# Patient Record
Sex: Female | Born: 1965 | Race: White | Hispanic: No | Marital: Married | Smoking: Never smoker
Health system: Southern US, Community
[De-identification: ages and names within clinical notes are randomized; demographics above are authoritative.]

## PROBLEM LIST (undated history)

## (undated) DIAGNOSIS — Z8489 Family history of other specified conditions: Secondary | ICD-10-CM

## (undated) DIAGNOSIS — R519 Headache, unspecified: Secondary | ICD-10-CM

## (undated) DIAGNOSIS — R51 Headache: Secondary | ICD-10-CM

## (undated) DIAGNOSIS — I499 Cardiac arrhythmia, unspecified: Secondary | ICD-10-CM

---

## 2005-12-13 ENCOUNTER — Ambulatory Visit: Payer: Self-pay | Admitting: Unknown Physician Specialty

## 2006-12-30 ENCOUNTER — Other Ambulatory Visit: Payer: Self-pay

## 2006-12-30 ENCOUNTER — Emergency Department: Payer: Self-pay | Admitting: Internal Medicine

## 2007-01-05 ENCOUNTER — Emergency Department: Payer: Self-pay | Admitting: Unknown Physician Specialty

## 2007-01-09 ENCOUNTER — Ambulatory Visit: Payer: Self-pay | Admitting: Family Medicine

## 2008-07-29 IMAGING — CR DG CHEST 1V PORT
1 series · 1 of 1 positions shown · non-contrast
Comparison: none

REASON FOR EXAM: Chest Pain
COMMENTS:

PROCEDURE:     DXR - DXR PORTABLE CHEST SINGLE VIEW  - December 30, 2006  [DATE]
RESULT:     The lungs are adequately inflated and clear. The heart is normal
in size. The pulmonary vascularity is not engorged. There is no pleural
effusion.

[view not recorded]
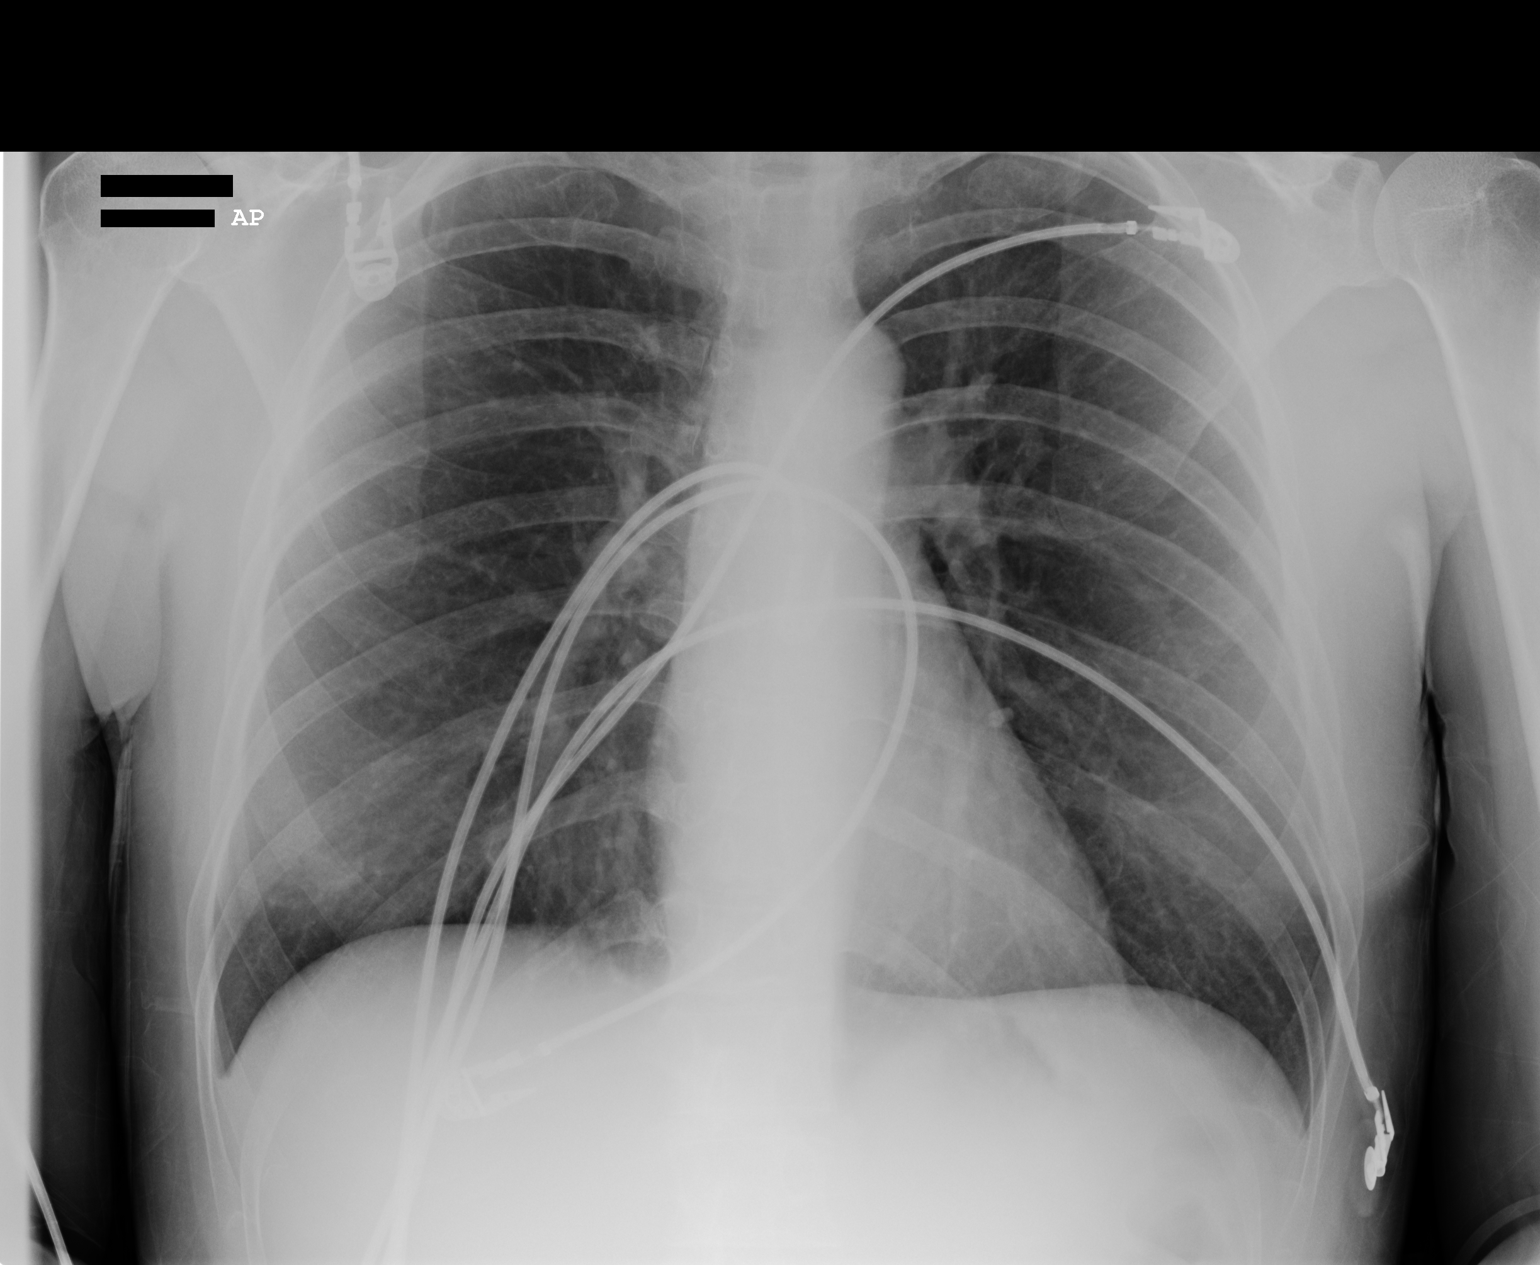

[1 of 1 positions shown; findings below may reference images not displayed]

IMPRESSION: I do not see evidence of CHF or pneumonia. There is mild
hyperinflation which may be voluntary or which may reflect underlying air
trapping. A followup PA and lateral chest x-ray would be of value.

## 2010-04-17 ENCOUNTER — Ambulatory Visit: Payer: Self-pay | Admitting: Unknown Physician Specialty

## 2011-06-05 DIAGNOSIS — F341 Dysthymic disorder: Secondary | ICD-10-CM | POA: Insufficient documentation

## 2012-01-25 ENCOUNTER — Ambulatory Visit: Payer: Self-pay | Admitting: Family Medicine

## 2013-10-21 ENCOUNTER — Ambulatory Visit: Payer: Self-pay | Admitting: Obstetrics and Gynecology

## 2014-09-15 ENCOUNTER — Ambulatory Visit: Payer: Self-pay | Admitting: Family Medicine

## 2014-09-15 LAB — BASIC METABOLIC PANEL
ANION GAP: 10 (ref 7–16)
BUN: 10 mg/dL (ref 7–18)
Calcium, Total: 9.1 mg/dL (ref 8.5–10.1)
Chloride: 104 mmol/L (ref 98–107)
Co2: 29 mmol/L (ref 21–32)
Creatinine: 0.68 mg/dL (ref 0.60–1.30)
EGFR (Non-African Amer.): >60
GLUCOSE: 94 mg/dL (ref 65–99)
Osmolality: 284 (ref 275–301)
Potassium: 4.8 mmol/L (ref 3.5–5.1)
Sodium: 143 mmol/L (ref 136–145)

## 2014-09-15 LAB — CBC WITH DIFFERENTIAL/PLATELET
Basophil #: 0 10*3/uL (ref 0.0–0.1)
Basophil %: 0.8 %
Eosinophil #: 0.1 10*3/uL (ref 0.0–0.7)
Eosinophil %: 1.2 %
HCT: 42.2 % (ref 35.0–47.0)
HGB: 13.8 g/dL (ref 12.0–16.0)
LYMPHS ABS: 0.9 10*3/uL — AB (ref 1.0–3.6)
LYMPHS PCT: 19.6 %
MCH: 30.9 pg (ref 26.0–34.0)
MCHC: 32.7 g/dL (ref 32.0–36.0)
MCV: 94 fL (ref 80–100)
Monocyte #: 0.4 x10 3/mm (ref 0.2–0.9)
Monocyte %: 8.3 %
NEUTROS ABS: 3.3 10*3/uL (ref 1.4–6.5)
Neutrophil %: 70.1 %
Platelet: 275 10*3/uL (ref 150–440)
RBC: 4.48 10*6/uL (ref 3.80–5.20)
RDW: 12.3 % (ref 11.5–14.5)
WBC: 4.7 10*3/uL (ref 3.6–11.0)

## 2014-09-15 LAB — CK-MB: CK-MB: 0.6 ng/mL (ref 0.5–3.6)

## 2014-09-15 LAB — D-DIMER(ARMC): D-Dimer: 294 ng/ml

## 2014-09-15 LAB — CK: CK, Total: 74 U/L (ref 26–192)

## 2014-09-28 DIAGNOSIS — R0789 Other chest pain: Secondary | ICD-10-CM | POA: Insufficient documentation

## 2015-03-03 ENCOUNTER — Encounter: Payer: Self-pay | Admitting: *Deleted

## 2015-03-11 NOTE — Discharge Instructions (Signed)

## 2015-03-14 ENCOUNTER — Ambulatory Visit: Payer: BC Managed Care – PPO | Admitting: Anesthesiology

## 2015-03-14 ENCOUNTER — Ambulatory Visit
Admission: RE | Admit: 2015-03-14 | Discharge: 2015-03-14 | Disposition: A | Discharge status: Home / Self Care | Payer: BC Managed Care – PPO | Source: Ambulatory Visit | Attending: Gastroenterology | Admitting: Gastroenterology

## 2015-03-14 ENCOUNTER — Encounter
Admission: RE | Disposition: A | Discharge status: Home / Self Care | Payer: Self-pay | Source: Ambulatory Visit | Attending: Gastroenterology

## 2015-03-14 ENCOUNTER — Other Ambulatory Visit: Payer: Self-pay | Admitting: Gastroenterology

## 2015-03-14 DIAGNOSIS — Z1211 Encounter for screening for malignant neoplasm of colon: Secondary | ICD-10-CM | POA: Insufficient documentation

## 2015-03-14 DIAGNOSIS — R002 Palpitations: Secondary | ICD-10-CM | POA: Insufficient documentation

## 2015-03-14 DIAGNOSIS — D122 Benign neoplasm of ascending colon: Secondary | ICD-10-CM | POA: Diagnosis not present

## 2015-03-14 DIAGNOSIS — K5730 Diverticulosis of large intestine without perforation or abscess without bleeding: Secondary | ICD-10-CM | POA: Diagnosis not present

## 2015-03-14 DIAGNOSIS — K640 First degree hemorrhoids: Secondary | ICD-10-CM | POA: Diagnosis not present

## 2015-03-14 HISTORY — PX: COLONOSCOPY: SHX5424

## 2015-03-14 HISTORY — DX: Headache: R51

## 2015-03-14 HISTORY — DX: Cardiac arrhythmia, unspecified: I49.9

## 2015-03-14 HISTORY — DX: Family history of other specified conditions: Z84.89

## 2015-03-14 HISTORY — DX: Headache, unspecified: R51.9

## 2015-03-14 SURGERY — COLONOSCOPY
Anesthesia: Monitor Anesthesia Care | Wound class: Contaminated

## 2015-03-14 MED ORDER — PROPOFOL 10 MG/ML IV BOLUS
INTRAVENOUS | Status: DC | PRN
  Administered 2015-03-14: 70 mg via INTRAVENOUS
  Administered 2015-03-14: 10 mg via INTRAVENOUS
  Administered 2015-03-14: 40 mg via INTRAVENOUS
  Administered 2015-03-14: 70 mg via INTRAVENOUS
  Administered 2015-03-14 (×2): 60 mg via INTRAVENOUS

## 2015-03-14 MED ORDER — STERILE WATER FOR IRRIGATION IR SOLN
Status: DC | PRN
  Administered 2015-03-14: 11:00:00

## 2015-03-14 MED ORDER — LACTATED RINGERS IV SOLN
INTRAVENOUS | Status: DC
  Administered 2015-03-14 (×2): via INTRAVENOUS

## 2015-03-14 MED ORDER — LIDOCAINE HCL (CARDIAC) 20 MG/ML IV SOLN
INTRAVENOUS | Status: DC | PRN
  Administered 2015-03-14: 30 mg via INTRAVENOUS

## 2015-03-14 SURGICAL SUPPLY — 28 items
CANISTER SUCT 1200ML W/VALVE (MISCELLANEOUS) ×3 IMPLANT
FCP ESCP3.2XJMB 240X2.8X (MISCELLANEOUS) ×1
FORCEPS BIOP RAD 4 LRG CAP 4 (CUTTING FORCEPS) IMPLANT
FORCEPS BIOP RJ4 240 W/NDL (MISCELLANEOUS) ×2
FORCEPS ESCP3.2XJMB 240X2.8X (MISCELLANEOUS) ×1 IMPLANT
GOWN CVR UNV OPN BCK APRN NK (MISCELLANEOUS) ×2 IMPLANT
GOWN ISOL THUMB LOOP REG UNIV (MISCELLANEOUS) ×4
HEMOCLIP INSTINCT (CLIP) IMPLANT
INJECTOR VARIJECT VIN23 (MISCELLANEOUS) IMPLANT
KIT CO2 TUBING (TUBING) IMPLANT
KIT DEFENDO VALVE AND CONN (KITS) IMPLANT
KIT ENDO PROCEDURE OLY (KITS) ×3 IMPLANT
LIGATOR MULTIBAND 6SHOOTER MBL (MISCELLANEOUS) IMPLANT
MARKER SPOT ENDO TATTOO 5ML (MISCELLANEOUS) IMPLANT
PAD GROUND ADULT SPLIT (MISCELLANEOUS) IMPLANT
SNARE SHORT THROW 13M SML OVAL (MISCELLANEOUS) IMPLANT
SNARE SHORT THROW 30M LRG OVAL (MISCELLANEOUS) IMPLANT
SPOT EX ENDOSCOPIC TATTOO (MISCELLANEOUS)
SUCTION POLY TRAP 4CHAMBER (MISCELLANEOUS) IMPLANT
TRAP SUCTION POLY (MISCELLANEOUS) IMPLANT
TUBING CONN 6MMX3.1M (TUBING)
TUBING SUCTION CONN 0.25 STRL (TUBING) IMPLANT
UNDERPAD 30X60 958B10 (PK) (MISCELLANEOUS) IMPLANT
VALVE BIOPSY ENDO (VALVE) IMPLANT
VARIJECT INJECTOR VIN23 (MISCELLANEOUS)
WATER AUXILLARY (MISCELLANEOUS) IMPLANT
WATER STERILE IRR 250ML POUR (IV SOLUTION) ×3 IMPLANT
WATER STERILE IRR 500ML POUR (IV SOLUTION) IMPLANT

## 2015-03-14 NOTE — Anesthesia Postprocedure Evaluation (Signed)
  Anesthesia Post-op Note  Patient: Savannah Ross  Procedure(s) Performed: Procedure(s): COLONOSCOPY (N/A)  Anesthesia type:MAC  Patient location: PACU  Post pain: Pain level controlled  Post assessment: Post-op Vital signs reviewed, Patient's Cardiovascular Status Stable, Respiratory Function Stable, Patent Airway and No signs of Nausea or vomiting  Post vital signs: Reviewed and stable  Last Vitals:  Filed Vitals:   03/14/15 1052  BP:   Pulse:   Temp: 36.4 C  Resp: 14    Level of consciousness: awake, alert  and patient cooperative  Complications: No apparent anesthesia complications

## 2015-03-14 NOTE — Transfer of Care (Signed)
Immediate Anesthesia Transfer of Care Note  Patient: Savannah Ross  Procedure(s) Performed: Procedure(s): COLONOSCOPY (N/A)  Patient Location: PACU  Anesthesia Type: MAC  Level of Consciousness: awake, alert  and patient cooperative  Airway and Oxygen Therapy: Patient Spontanous Breathing and Patient connected to supplemental oxygen  Post-op Assessment: Post-op Vital signs reviewed, Patient's Cardiovascular Status Stable, Respiratory Function Stable, Patent Airway and No signs of Nausea or vomiting  Post-op Vital Signs: Reviewed and stable  Complications: No apparent anesthesia complications

## 2015-03-14 NOTE — H&P (Signed)
  [facility]  [address] [place], [place] [address] Phone: [phone] Fax : [phone]  Primary Care Physician:  No primary care provider on file. Primary Gastroenterologist:  Dr. Allen Norris  Pre-Procedure History & Physical: HPI:  Savannah Ross is a 49 y.o. female is here for a screening colonoscopy.   Past Medical History  Diagnosis Date  . Family history of adverse reaction to anesthesia     mother and sister - PONV  . Dysrhythmia     palpatations - stress/echo - see note Carre Everywhere - Dr. Ubaldo Glassing 10/19/14  . Headache     migraines - rare    History reviewed. No pertinent past surgical history.  Prior to Admission medications   Not on File    Allergies as of 03/03/2015  . (No Known Allergies)    History reviewed. No pertinent family history.  History   Social History  . Marital Status: Married    Spouse Name: N/A  . Number of Children: N/A  . Years of Education: N/A   Occupational History  . Not on file.   Social History Main Topics  . Smoking status: Never Smoker   . Smokeless tobacco: Not on file  . Alcohol Use: No  . Drug Use: Not on file  . Sexual Activity: Not on file   Other Topics Concern  . Not on file   Social History Narrative  . No narrative on file    Review of Systems: See HPI, otherwise negative ROS  Physical Exam: BP 132/98 mmHg  Pulse 64  Temp(Src) 97.9 F (36.6 C) (Temporal)  Resp 16  Ht 5\' 10"  (1.778 m)  Wt 179 lb (81.194 kg)  BMI 25.68 kg/m2  SpO2 99%  LMP  General:   Alert,  pleasant and cooperative in NAD Head:  Normocephalic and atraumatic. Neck:  Supple; no masses or thyromegaly. Lungs:  Clear throughout to auscultation.    Heart:  Regular rate and rhythm. Abdomen:  Soft, nontender and nondistended. Normal bowel sounds, without guarding, and without rebound.   Neurologic:  Alert and  oriented x4;  grossly normal neurologically.  Impression/Plan: Savannah Ross is now here to  undergo a screening colonoscopy.  Risks, benefits, and alternatives regarding colonoscopy have been reviewed with the patient.  Questions have been answered.  All parties agreeable.

## 2015-03-14 NOTE — Op Note (Signed)
[facility] Patient Name: Savannah Ross Procedure Date: 03/14/2015 10:28 AM MRN: [id number] Account #: [id number] Date of Birth: 03/09/1966 Admit Type: Outpatient Age: 49 Room: [facility] OR ROOM 01 Gender: Female Note Status: Finalized Procedure:         Colonoscopy Indications:       Screening for colorectal malignant neoplasm Providers:         Lucilla Lame, MD Referring MD:      Dani Gobble. Huprich (Referring MD) Medicines:         Propofol per Anesthesia Complications:     No immediate complications. Procedure:         Pre-Anesthesia Assessment:                    - Prior to the procedure, a History and Physical was                     performed, and patient medications and allergies were                     reviewed. The patient's tolerance of previous anesthesia                     was also reviewed. The risks and benefits of the procedure                     and the sedation options and risks were discussed with the                     patient. All questions were answered, and informed consent                     was obtained. Prior Anticoagulants: The patient has taken                     no previous anticoagulant or antiplatelet agents. ASA                     Grade Assessment: II - A patient with mild systemic                     disease. After reviewing the risks and benefits, the                     patient was deemed in satisfactory condition to undergo                     the procedure.                    After obtaining informed consent, the colonoscope was                     passed under direct vision. Throughout the procedure, the                     patient's blood pressure, pulse, and oxygen saturations                     were monitored continuously. The Colonoscope was                     introduced through the anus and advanced to the the cecum,  identified by appendiceal orifice and ileocecal valve. The               colonoscopy was performed without difficulty. The patient                     tolerated the procedure well. The quality of the bowel                     preparation was excellent. Findings:      The perianal and digital rectal examinations were normal.      A 4 mm polyp was found in the ascending colon. The polyp was sessile.       The polyp was removed with a cold biopsy forceps. Resection and       retrieval were complete.      Multiple small-mouthed diverticula were found in the sigmoid colon.      Non-bleeding internal hemorrhoids were found during retroflexion. The       hemorrhoids were Grade I (internal hemorrhoids that do not prolapse). Impression:        - One 4 mm polyp in the ascending colon. Resected and                     retrieved.                    - Diverticulosis in the sigmoid colon.                    - Non-bleeding internal hemorrhoids. Recommendation:    - Await pathology results.                    - Repeat colonoscopy in 5 years if polyp adenoma and 10                     years if hyperplastic Procedure Code(s): --- Professional ---                    [phone], Colonoscopy, flexible; with biopsy, single or                     multiple Diagnosis Code(s): --- Professional ---                    Z12.11, Encounter for screening for malignant neoplasm of                     colon                    D12.2, Benign neoplasm of ascending colon CPT copyright 2014 American Medical Association. All rights reserved. The codes documented in this report are preliminary and upon coder review may  be revised to meet current compliance requirements. Lucilla Lame, MD 03/14/2015 10:50:15 AM This report has been signed electronically. Number of Addenda: 0 Note Initiated On: 03/14/2015 10:28 AM Scope Withdrawal Time: 0 hours 6 minutes 52 seconds  Total Procedure Duration: 0 hours 12 minutes 26 seconds       [facility]

## 2015-03-14 NOTE — Anesthesia Procedure Notes (Signed)
Procedure Name: MAC Performed by: Amelda Hapke Pre-anesthesia Checklist: Patient identified, Emergency Drugs available, Suction available, Patient being monitored and Timeout performed Patient Re-evaluated:Patient Re-evaluated prior to inductionOxygen Delivery Method: Nasal cannula       

## 2015-03-14 NOTE — Anesthesia Preprocedure Evaluation (Signed)
Anesthesia Evaluation  Patient identified by MRN, date of birth, ID band Patient awake    Airway Mallampati: II  TM Distance: >3 FB Neck ROM: Full    Dental   Pulmonary          Cardiovascular     Neuro/Psych    GI/Hepatic   Endo/Other    Renal/GU      Musculoskeletal   Abdominal   Peds  Hematology   Anesthesia Other Findings   Reproductive/Obstetrics                             Anesthesia Physical Anesthesia Plan  ASA: I  Anesthesia Plan: MAC   Post-op Pain Management:    Induction:   Airway Management Planned:   Additional Equipment:   Intra-op Plan:   Post-operative Plan:   Informed Consent: I have reviewed the patients History and Physical, chart, labs and discussed the procedure including the risks, benefits and alternatives for the proposed anesthesia with the patient or authorized representative who has indicated his/her understanding and acceptance.     Plan Discussed with: CRNA  Anesthesia Plan Comments:         Anesthesia Quick Evaluation

## 2015-03-15 ENCOUNTER — Encounter: Payer: Self-pay | Admitting: Gastroenterology

## 2015-05-09 ENCOUNTER — Encounter: Payer: Self-pay | Admitting: Gastroenterology

## 2015-09-17 ENCOUNTER — Ambulatory Visit
Admission: EM | Admit: 2015-09-17 | Discharge: 2015-09-17 | Disposition: A | Discharge status: Home / Self Care | Payer: BC Managed Care – PPO | Attending: Internal Medicine | Admitting: Internal Medicine

## 2015-09-17 DIAGNOSIS — J069 Acute upper respiratory infection, unspecified: Secondary | ICD-10-CM

## 2015-09-17 DIAGNOSIS — H6982 Other specified disorders of Eustachian tube, left ear: Secondary | ICD-10-CM

## 2015-09-17 MED ORDER — AZITHROMYCIN 250 MG PO TABS: 250.0000 mg | ORAL_TABLET | Freq: Every day | ORAL | Status: DC

## 2015-09-17 MED ORDER — FLUTICASONE PROPIONATE 50 MCG/ACT NA SUSP: 2.0000 | Freq: Every day | NASAL | Status: DC

## 2015-09-17 NOTE — ED Provider Notes (Signed)
CSN: [id number]     Arrival date & time 09/17/15  1157 History   First MD Initiated Contact with Patient 09/17/15 1342     Chief Complaint  Patient presents with  . Sore Throat    Pt with left sided sore throat and coughing up yellow phlegm. No fever. Pain 6/10. Has been to PCP twice for same and no ABX given, only Tussionex and nasal spray.   (Consider location/radiation/quality/duration/timing/severity/associated sxs/prior Treatment) HPI  49 yo F reports 2 week hx of sore throat and cough. Seen twice by PCP with Strep reported negative. Now having left facial pressure discomfort,left eye drainage without conjunctivitis, left ant chain tenderness  that decreases with direct pressure,Cough with phlegm . Sleeps on left side.  Has Tussionex from PCP. They did not do 3 day report Denies hx BP elevation- has been taking cough/cold medications Past Medical History  Diagnosis Date  . Family history of adverse reaction to anesthesia     mother and sister - PONV  . Dysrhythmia     palpatations - stress/echo - see note Carre Everywhere - Dr. Ubaldo Glassing 10/19/14  . Headache     migraines - rare   Past Surgical History  Procedure Laterality Date  . Colonoscopy N/A 03/14/2015    Procedure: COLONOSCOPY;  Surgeon: Lucilla Lame, MD;  Location: [place];  Service: Gastroenterology;  Laterality: N/A;   History reviewed. No pertinent family history. Social History  Substance Use Topics  . Smoking status: Never Smoker   . Smokeless tobacco: None  . Alcohol Use: No   OB History    No data available     Review of Systems Constitutional: No fever.  Eyes: No visual changes. Drainage today left [id number] sore throat, left side.; left ear pops and squeaks, pressure Cardiovascular:Negative for chest pain/palpitations Respiratory: Negative for shortness of breath Gastrointestinal: No abdominal pain. No nausea,vomiting, diarrhea Genitourinary: Negative for dysuria. Normal  urination. Musculoskeletal: Negative for back pain. FROM extremities without pain Skin: Negative for rash Neurological: Negative for headache, focal weakness or numbness  Allergies  Review of patient's allergies indicates no known allergies.  Home Medications   Prior to Admission medications   Medication Sig Start Date End Date Taking? Authorizing Provider  chlorpheniramine-HYDROcodone (TUSSIONEX) 10-8 MG/5ML SUER Take 5 mLs by mouth.   Yes Historical Provider, MD  azithromycin (ZITHROMAX) 250 MG tablet Take 1 tablet (250 mg total) by mouth daily. Take first 2 tablets together, then 1 every day until finished. 09/17/15   Jan Fireman, PA-C  fluticasone [facility]) 50 MCG/ACT nasal spray Place 2 sprays into both nostrils daily. 09/17/15   Jan Fireman, PA-C   Meds Ordered and Administered this Visit  Medications - No data to display  BP 155/90 mmHg  Pulse 90  Temp(Src) 97.9 F (36.6 C) (Oral)  Resp 18  Ht 5\' 10"  (1.778 m)  Wt 170 lb (77.111 kg)  BMI 24.39 kg/m2  SpO2 100% No data found.   Physical Exam  General: NAD, does not appear toxic HEENT:normocephalic,atraumatic, mucous membranes moist,no increased erythema.  no visual implication of peritonsillar abscess, may have supra-epiglottitis; Ears: grossly normal hearing, left TM retracted,  Eyes: EOMI, conjunctiva clear, conjugate gaze Neck: supple,no lymphadenopathy Resp : CT A, bilat; normal respiratory effort Card : RRR Abd:  Not distended Skin: no rash, skin intact MSK: no deformities, ambulatory without assistance Neuro : good attention,recall-good memory, no focal neuro deficits Psych: speech and behavior appropriate   ED Course  Procedures (including critical care  time)  Labs Review Labs Reviewed - No data to display  Imaging Review No results found.       MDM   1. Eustachian tube dysfunction, left   2. URI (upper respiratory infection)     Plan: Test/x-ray results and diagnosis reviewed with  patient Rx as per orders;  benefits, risks, potential side effects reviewed   Recommend supportive treatment with cyclic tylenol and ibuprofen Add Zyrtec Add Fluticasone Use Tussionex from PCP bedtime Add Bezonatate Encourage increased hydration, steamy showers, Seek additional medical care if symptoms worsen or are not improvin  Discharge Medication List as of 09/17/2015  2:18 PM    START taking these medications   Details  azithromycin (ZITHROMAX) 250 MG tablet Take 1 tablet (250 mg total) by mouth daily. Take first 2 tablets together, then 1 every day until finished., Starting 09/17/2015, Until Discontinued, Print        Jan Fireman, PA-C 09/20/15 1000

## 2015-09-17 NOTE — Discharge Instructions (Signed)
Add back Zyrtec 10mg  once daily   Add Fluticasone nasal spray as directed  Add Zpack  2 tablets today then one tablet daily for 4 days  Increase fluids by mouth, steam, vaporizer  Ibuprofen 600mg  ( 3 tabs) three tablets with meals for 3-5 days    Barotitis Media Barotitis media is inflammation of your middle ear. This occurs when the auditory tube (eustachian tube) leading from the back of your nose (nasopharynx) to your eardrum is blocked. This blockage may result from a cold, environmental allergies, or an upper respiratory infection. Unresolved barotitis media may lead to damage or hearing loss (barotrauma), which may become permanent. HOME CARE INSTRUCTIONS   Use medicines as recommended by your health care provider. Over-the-counter medicines will help unblock the canal and can help during times of air travel.  Do not put anything into your ears to clean or unplug them. Eardrops will not be helpful.  Do not swim, dive, or fly until your health care provider says it is all right to do so. If these activities are necessary, chewing gum with frequent, forceful swallowing may help. It is also helpful to hold your nose and gently blow to pop your ears for equalizing pressure changes. This forces air into the eustachian tube.  Only take over-the-counter or prescription medicines for pain, discomfort, or fever as directed by your health care provider.  A decongestant may be helpful in decongesting the middle ear and make pressure equalization easier. SEEK MEDICAL CARE IF:  You experience a serious form of dizziness in which you feel as if the room is spinning and you feel nauseated (vertigo).  Your symptoms only involve one ear. SEEK IMMEDIATE MEDICAL CARE IF:   You develop a severe headache, dizziness, or severe ear pain.  You have bloody or pus-like drainage from your ears.  You develop a fever.  Your problems do not improve or become worse. MAKE SURE YOU:   Understand these  instructions.  Will watch your condition.  Will get help right away if you are not doing well or get worse.   This information is not intended to replace advice given to you by your health care provider. Make sure you discuss any questions you have with your health care provider.   Document Released: 09/14/2000 Document Revised: 07/08/2013 Document Reviewed: 04/14/2013 Elsevier Interactive Patient Education [employer].

## 2018-07-01 ENCOUNTER — Ambulatory Visit
Admission: RE | Admit: 2018-07-01 | Discharge: 2018-07-01 | Disposition: A | Discharge status: Home / Self Care | Payer: BC Managed Care – PPO | Source: Ambulatory Visit | Attending: Family Medicine | Admitting: Family Medicine

## 2018-07-01 ENCOUNTER — Other Ambulatory Visit: Payer: Self-pay | Admitting: Family Medicine

## 2018-07-01 DIAGNOSIS — S92512A Displaced fracture of proximal phalanx of left lesser toe(s), initial encounter for closed fracture: Secondary | ICD-10-CM | POA: Insufficient documentation

## 2018-07-01 DIAGNOSIS — S99922A Unspecified injury of left foot, initial encounter: Secondary | ICD-10-CM

## 2018-07-01 DIAGNOSIS — M79675 Pain in left toe(s): Secondary | ICD-10-CM | POA: Diagnosis not present

## 2018-07-01 DIAGNOSIS — R600 Localized edema: Secondary | ICD-10-CM | POA: Insufficient documentation

## 2018-12-01 DIAGNOSIS — J301 Allergic rhinitis due to pollen: Secondary | ICD-10-CM | POA: Insufficient documentation

## 2019-06-11 ENCOUNTER — Other Ambulatory Visit (HOSPITAL_COMMUNITY)
Admission: RE | Admit: 2019-06-11 | Discharge: 2019-06-11 | Disposition: A | Discharge status: Home / Self Care | Payer: BC Managed Care – PPO | Source: Ambulatory Visit | Attending: Obstetrics and Gynecology | Admitting: Obstetrics and Gynecology

## 2019-06-11 ENCOUNTER — Encounter: Payer: Self-pay | Admitting: Obstetrics and Gynecology

## 2019-06-11 ENCOUNTER — Ambulatory Visit (INDEPENDENT_AMBULATORY_CARE_PROVIDER_SITE_OTHER): Payer: BC Managed Care – PPO | Admitting: Obstetrics and Gynecology

## 2019-06-11 ENCOUNTER — Other Ambulatory Visit: Payer: Self-pay

## 2019-06-11 VITALS — BP 133/87 | HR 80 | Ht 70.0 in | Wt 189.0 lb

## 2019-06-11 DIAGNOSIS — M25569 Pain in unspecified knee: Secondary | ICD-10-CM

## 2019-06-11 DIAGNOSIS — Z124 Encounter for screening for malignant neoplasm of cervix: Secondary | ICD-10-CM

## 2019-06-11 DIAGNOSIS — E663 Overweight: Secondary | ICD-10-CM

## 2019-06-11 DIAGNOSIS — Z6827 Body mass index (BMI) 27.0-27.9, adult: Secondary | ICD-10-CM | POA: Diagnosis not present

## 2019-06-11 DIAGNOSIS — G4700 Insomnia, unspecified: Secondary | ICD-10-CM

## 2019-06-11 DIAGNOSIS — Z1239 Encounter for other screening for malignant neoplasm of breast: Secondary | ICD-10-CM

## 2019-06-11 DIAGNOSIS — Z01419 Encounter for gynecological examination (general) (routine) without abnormal findings: Secondary | ICD-10-CM

## 2019-06-11 MED ORDER — PHENTERMINE HCL 37.5 MG PO TABS: 37.5000 mg | ORAL_TABLET | Freq: Every day | ORAL | 0 refills | Status: DC

## 2019-06-11 MED ORDER — TRAZODONE HCL 50 MG PO TABS: 50.0000 mg | ORAL_TABLET | Freq: Every evening | ORAL | 3 refills | Status: DC | PRN

## 2019-06-11 NOTE — Progress Notes (Signed)
Gynecology Annual Exam  PCP: Gayland Curry, MD  Chief Complaint:  Chief Complaint  Patient presents with  . Gynecologic Exam    History of Present Illness:Patient is a 53 y.o. No obstetric history on file. presents for annual exam. The patient has no complaints today.   LMP: No LMP recorded. Patient has had an ablation.  The patient is sexually active. She denies dyspareunia.   There is no notable family history of breast or ovarian cancer in her family.  The patient wears seatbelts: yes.     The patient denies current symptoms of depression.      Patientis a 53 y.o. G2P2 female, who presents for the evaluation of weight gain. She has gained 10 pounds primarily over past year. The patient states the following issues have contributed to her weight problem: menopause, decreased time for physical activity although her diet has remained the same.  [place] also [place]  The patient has  additional symptoms, joint pain . The patient specifically denies memory loss, muscle weakness, excessive thirst, and polyuria.  The patient's past medical history is non-contributory. She has not tried interventions in the past.  Review of Systems: Review of Systems  Constitutional: Negative for chills and fever.  HENT: Negative for congestion.   Respiratory: Negative for cough and shortness of breath.   Cardiovascular: Negative for chest pain and palpitations.  Gastrointestinal: Negative for abdominal pain, constipation, diarrhea, heartburn, nausea and vomiting.  Genitourinary: Negative for dysuria, frequency and urgency.  Skin: Negative for itching and rash.  Neurological: Negative for dizziness and headaches.  Endo/Heme/Allergies: Negative for polydipsia.  Psychiatric/Behavioral: Negative for depression.    Past Medical History:  Past Medical History:  Diagnosis Date  . Dysrhythmia    palpatations - stress/echo - see note Carre Everywhere - Dr. Ubaldo Glassing 10/19/14   . Family history of adverse reaction to anesthesia    mother and sister - PONV  . Headache    migraines - rare    Past Surgical History:  Past Surgical History:  Procedure Laterality Date  . COLONOSCOPY N/A 03/14/2015   Procedure: COLONOSCOPY;  Surgeon: Lucilla Lame, MD;  Location: [facility];  Service: Gastroenterology;  Laterality: N/A;    Gynecologic History:  No LMP recorded. Patient has had an ablation.  Obstetric History: No obstetric history on file.  Family History:  History reviewed. No pertinent family history.  Social History:  Social History   Socioeconomic History  . Marital status: Married    Spouse name: Not on file  . Number of children: Not on file  . Years of education: Not on file  . Highest education level: Not on file  Occupational History  . Not on file  Social Needs  . Financial resource strain: Not on file  . Food insecurity    Worry: Not on file    Inability: Not on file  . Transportation needs    Medical: Not on file    Non-medical: Not on file  Tobacco Use  . Smoking status: Never Smoker  . Smokeless tobacco: Never Used  Substance and Sexual Activity  . Alcohol use: No  . Drug use: Never  . Sexual activity: Yes    Birth control/protection: None  Lifestyle  . Physical activity    Days per week: Not on file    Minutes per session: Not on file  . Stress: Not on file  Relationships  . Social Herbalist on  phone: Not on file    Gets together: Not on file    Attends religious service: Not on file    Active member of club or organization: Not on file    Attends meetings of clubs or organizations: Not on file    Relationship status: Not on file  . Intimate partner violence    Fear of current or ex partner: Not on file    Emotionally abused: Not on file    Physically abused: Not on file    Forced sexual activity: Not on file  Other Topics Concern  . Not on file  Social History Narrative  . Not on file     Allergies:  No Known Allergies  Medications: Prior to Admission medications   Medication Sig Start Date End Date Taking? Authorizing Provider  azithromycin (ZITHROMAX) 250 MG tablet Take 1 tablet (250 mg total) by mouth daily. Take first 2 tablets together, then 1 every day until finished. 09/17/15   Jan Fireman, PA-C  chlorpheniramine-HYDROcodone (TUSSIONEX) 10-8 MG/5ML SUER Take 5 mLs by mouth.    [provider]  fluticasone (FLONASE) 50 MCG/ACT nasal spray Place 2 sprays into both nostrils daily. 09/17/15   Jan Fireman, PA-C    Physical Exam Vitals: Blood pressure 133/87, pulse 80, height 5\' 10"  (1.778 m), weight 189 lb (85.7 kg).   General: NAD HEENT: normocephalic, anicteric Thyroid: no enlargement, no palpable nodules Pulmonary: No increased work of breathing, CTAB Cardiovascular: RRR, distal pulses 2+ Breast: Breast symmetrical, no tenderness, no palpable nodules or masses, no skin or nipple retraction present, no nipple discharge.  No axillary or supraclavicular lymphadenopathy. Abdomen: NABS, soft, non-tender, non-distended.  Umbilicus without lesions.  No hepatomegaly, splenomegaly or masses palpable. No evidence of hernia  Genitourinary:  External: Normal external female genitalia.  Normal urethral meatus, normal Bartholin's and Skene's glands.    Vagina: Normal vaginal mucosa, no evidence of prolapse.    Cervix: Grossly normal in appearance, no bleeding  Uterus: Non-enlarged, mobile, normal contour.  No CMT  Adnexa: ovaries non-enlarged, no adnexal masses  Rectal: deferred  Lymphatic: no evidence of inguinal lymphadenopathy Extremities: no edema, erythema, or tenderness Neurologic: Grossly intact Psychiatric: mood appropriate, affect full  Female chaperone present for pelvic and breast  portions of the physical exam     Assessment: 52 y.o. No obstetric history on file. routine annual exam  Plan: Problem List Items Addressed This Visit    None     Visit Diagnoses    Encounter for gynecological examination without abnormal finding    -  Primary   Screening for malignant neoplasm of cervix       Relevant Orders   Cytology - PAP   Breast screening       Relevant Orders   MM 3D SCREEN BREAST BILATERAL   Insomnia, unspecified type       Arthralgia of knee, unspecified laterality       Overweight (BMI 25.0-29.9)       BMI 27.0-27.9,adult         Annual  1) Mammogram - recommend yearly screening mammogram.  Mammogram Was ordered today  2) STI screening  was notoffered and therefore not obtained  3) ASCCP guidelines and rational discussed.  Patient opts for every 3 years screening interval  4) Osteoporosis  - per USPTF routine screening DEXA at age 23  Consider FDA-approved medical therapies in postmenopausal women and men aged 69 years and older, based on the following: a) A hip or  vertebral (clinical or morphometric) fracture b) T-score ? -2.5 at the femoral neck or spine after appropriate evaluation to exclude secondary causes C) Low bone mass (T-score between -1.0 and -2.5 at the femoral neck or spine) and a 10-year probability of a hip fracture ? 3% or a 10-year probability of a major osteoporosis-related fracture ? 20% based on the [place]-adapted WHO algorithm   5) Routine healthcare maintenance including cholesterol, diabetes screening discussed managed by PCP  6) Colonoscopy  - 03/14/2015 [place], 1 polyp tubular adenoma negative for high grade dysplasia  - repeat 5 years 2021  7) Insomnia - trial of trazodone  8) Return in about 4 weeks (around 07/09/2019) for telephone visit medication follow up.   Medical Weight Loss  1) 1500 Calorie ADA Diet  2) Patient education given regarding appropriate lifestyle changes for weight loss including: regular physical activity, healthy coping strategies, caloric restriction and healthy eating patterns.  3) Patient will be started on weight loss medication. The risks and benefits and  side effects of medication, such as Adipex (Phenteramine) ,  Tenuate (Diethylproprion), Belviq (lorcarsin), Contrave (buproprion/naltrexone), Qsymia (phentermine/topiramate), and Saxenda (liraglutide) is discussed. The pros and cons of suppressing appetite and boosting metabolism is discussed. Risks of tolerence and addiction is discussed for selected agents discussed. Use of medicine will ne short term, such as 3-4 months at a time followed by a period of time off of the medicine to avoid these risks and side effects for Adipex, Qsymia, and Tenuate discussed. Pt to call with any negative side effects and agrees to keep follow up appts.  4) Comorbidity Screening - hypothyroidism screening, diabetes, and hyperlipidemia screening offered  5) Encouraged weekly weight monitorig to track progress and sample 1 week food diary  6) Contraception - N/A  7) 15 minutes face-to-face; counseling/coordination of care > 50 percent of visit  8) Follow up in 4 weeks to assess response       Malachy Mood, MD [place], [place] 06/11/2019, 3:31 PM

## 2019-06-11 NOTE — Patient Instructions (Signed)
[facility] [address] [place] [address]  [facility]  [address] [place] [address]  Phone: [phone]  

## 2019-06-15 LAB — CYTOLOGY - PAP
Diagnosis: NEGATIVE
HPV: NOT DETECTED

## 2019-07-09 ENCOUNTER — Other Ambulatory Visit: Payer: Self-pay

## 2019-07-09 ENCOUNTER — Encounter: Payer: Self-pay | Admitting: Obstetrics and Gynecology

## 2019-07-09 ENCOUNTER — Ambulatory Visit (INDEPENDENT_AMBULATORY_CARE_PROVIDER_SITE_OTHER): Payer: BC Managed Care – PPO | Admitting: Obstetrics and Gynecology

## 2019-07-09 VITALS — Ht 70.5 in | Wt 183.0 lb

## 2019-07-09 DIAGNOSIS — Z6825 Body mass index (BMI) 25.0-25.9, adult: Secondary | ICD-10-CM | POA: Diagnosis not present

## 2019-07-09 DIAGNOSIS — E663 Overweight: Secondary | ICD-10-CM

## 2019-07-09 MED ORDER — PHENTERMINE HCL 37.5 MG PO TABS: 37.5000 mg | ORAL_TABLET | Freq: Every day | ORAL | 0 refills | Status: DC

## 2019-07-09 NOTE — Progress Notes (Signed)
I connected with Savannah Ross on 07/13/19 at  4:10 PM EDT by telephone and verified that I am speaking with the correct person using two identifiers.   I discussed the limitations, risks, security and privacy concerns of performing an evaluation and management service by telephone and the availability of in person appointments. I also discussed with the patient that there may be a patient responsible charge related to this service. The patient expressed understanding and agreed to proceed.  The patient was at home I spoke with the patient from my workstation phone The names of people involved in this encounter were: Savannah Ross , and Malachy Mood   Gynecology Office Visit  Chief Complaint:  Chief Complaint  Patient presents with  . Weight Check    History of Present Illness: Patientis a 53 y.o. G2P2 female, who presents for the evaluation of the desire to lose weight. She has lost 5 pounds 1 months. The patient states the following symptoms since starting her weight loss therapy: appetite suppression, energy, and weight loss.  The patient also reports no other ill effects. The patient specifically denies heart palpitations, anxiety, and insomnia.    Review of Systems: 10 point review of systems negative unless otherwise noted in HPI  Past Medical History:  Past Medical History:  Diagnosis Date  . Dysrhythmia    palpatations - stress/echo - see note Carre Everywhere - Dr. Ubaldo Glassing 10/19/14  . Family history of adverse reaction to anesthesia    mother and sister - PONV  . Headache    migraines - rare    Past Surgical History:  Past Surgical History:  Procedure Laterality Date  . COLONOSCOPY N/A 03/14/2015   Procedure: COLONOSCOPY;  Surgeon: Lucilla Lame, MD;  Location: [place];  Service: Gastroenterology;  Laterality: N/A;    Gynecologic History: No LMP recorded. Patient has had an ablation.  Obstetric History: G2P2  Family History:  No family  history on file.  Social History:  Social History   Socioeconomic History  . Marital status: Married    Spouse name: Not on file  . Number of children: Not on file  . Years of education: Not on file  . Highest education level: Not on file  Occupational History  . Not on file  Social Needs  . Financial resource strain: Not on file  . Food insecurity    Worry: Not on file    Inability: Not on file  . Transportation needs    Medical: Not on file    Non-medical: Not on file  Tobacco Use  . Smoking status: Never Smoker  . Smokeless tobacco: Never Used  Substance and Sexual Activity  . Alcohol use: No  . Drug use: Never  . Sexual activity: Yes    Birth control/protection: None  Lifestyle  . Physical activity    Days per week: Not on file    Minutes per session: Not on file  . Stress: Not on file  Relationships  . Social Herbalist on phone: Not on file    Gets together: Not on file    Attends religious service: Not on file    Active member of club or organization: Not on file    Attends meetings of clubs or organizations: Not on file    Relationship status: Not on file  . Intimate partner violence    Fear of current or ex partner: Not on file    Emotionally abused: Not on file  Physically abused: Not on file    Forced sexual activity: Not on file  Other Topics Concern  . Not on file  Social History Narrative  . Not on file    Allergies:  No Known Allergies  Medications: Prior to Admission medications   Medication Sig Start Date End Date Taking? Authorizing Provider  phentermine (ADIPEX-P) 37.5 MG tablet Take 1 tablet (37.5 mg total) by mouth daily before breakfast. 06/11/19  Yes Malachy Mood, MD  traZODone (DESYREL) 50 MG tablet Take 1 tablet (50 mg total) by mouth at bedtime as needed for sleep. 06/11/19  Yes Malachy Mood, MD    Physical Exam Height 5' 10.5" (1.791 m), weight 183 lb (83 kg). Wt Readings from Last 3 Encounters:  07/09/19  183 lb (83 kg)  06/11/19 189 lb (85.7 kg)  09/17/15 170 lb (77.1 kg)   Body mass index is 25.89 kg/m.  No physical exam as this was a remote telephone visit to promote social distancing during the current COVID-19 Pandemic  Assessment: 53 y.o. G2P2 medical weight loss follow up  Plan: Problem List Items Addressed This Visit    None    Visit Diagnoses    Overweight (BMI 25.0-29.9)    -  Primary   BMI 25.0-25.9,adult          1) 1500 Calorie ADA Diet  2) Patient education given regarding appropriate lifestyle changes for weight loss including: regular physical activity, healthy coping strategies, caloric restriction and healthy eating patterns.  3) Patient will be started on weight loss medication. The risks and benefits and side effects of medication, such as Adipex (Phenteramine) ,  Tenuate (Diethylproprion), Belviq (lorcarsin), Contrave (buproprion/naltrexone), Qsymia (phentermine/topiramate), and Saxenda (liraglutide) is discussed. The pros and cons of suppressing appetite and boosting metabolism is discussed. Risks of tolerence and addiction is discussed for selected agents discussed. Use of medicine will ne short term, such as 3-4 months at a time followed by a period of time off of the medicine to avoid these risks and side effects for Adipex, Qsymia, and Tenuate discussed. Pt to call with any negative side effects and agrees to keep follow up appts.  4) Patient to take medication, with the benefits of appetite suppression and metabolism boost d/w pt, along with the side effects and risk factors of long term use that will be avoided with our use of short bursts of therapy. Rx provided.    5) Telephone time 57min  6)  Return in about 4 weeks (around 08/06/2019) for medication follow up phone.    Malachy Mood, MD, [facility], [facility] 07/09/2019, 4:24 PM

## 2019-08-06 ENCOUNTER — Ambulatory Visit (INDEPENDENT_AMBULATORY_CARE_PROVIDER_SITE_OTHER): Payer: BC Managed Care – PPO | Admitting: Obstetrics and Gynecology

## 2019-08-06 ENCOUNTER — Other Ambulatory Visit: Payer: Self-pay

## 2019-08-06 ENCOUNTER — Encounter: Payer: Self-pay | Admitting: Obstetrics and Gynecology

## 2019-08-06 VITALS — Ht 70.5 in | Wt 177.0 lb

## 2019-08-06 DIAGNOSIS — E663 Overweight: Secondary | ICD-10-CM

## 2019-08-06 DIAGNOSIS — Z6825 Body mass index (BMI) 25.0-25.9, adult: Secondary | ICD-10-CM

## 2019-08-06 MED ORDER — PHENTERMINE HCL 37.5 MG PO TABS: 37.5000 mg | ORAL_TABLET | Freq: Every day | ORAL | 0 refills | Status: AC

## 2019-08-06 NOTE — Progress Notes (Signed)
I connected with Savannah Ross on 08/06/19 at  4:10 PM EST by telephone and verified that I am speaking with the correct person using two identifiers.   I discussed the limitations, risks, security and privacy concerns of performing an evaluation and management service by telephone and the availability of in person appointments. I also discussed with the patient that there may be a patient responsible charge related to this service. The patient expressed understanding and agreed to proceed.  The patient was at home I spoke with the patient from my workstation phone The names of people involved in this encounter were: Savannah Ross , and Malachy Mood   Gynecology Office Visit  Chief Complaint: No chief complaint on file.   History of Present Illness: Patientis a 53 y.o. Savannah Ross female, who presents for the evaluation of the desire to lose weight. She has lost 6 pounds 1 months, 2 month weight loss of 12lbs. The patient states the following symptoms since starting her weight loss therapy: appetite suppression, energy, and weight loss.  The patient also reports no other ill effects. The patient specifically denies heart palpitations, anxiety, and insomnia.    Review of Systems: 10 point review of systems negative unless otherwise noted in HPI  Past Medical History:  Past Medical History:  Diagnosis Date  . Dysrhythmia    palpatations - stress/echo - see note Carre Everywhere - Dr. Ubaldo Glassing 10/19/14  . Family history of adverse reaction to anesthesia    mother and sister - PONV  . Headache    migraines - rare    Past Surgical History:  Past Surgical History:  Procedure Laterality Date  . COLONOSCOPY N/A 03/14/2015   Procedure: COLONOSCOPY;  Surgeon: Lucilla Lame, MD;  Location: [place];  Service: Gastroenterology;  Laterality: N/A;    Gynecologic History: No LMP recorded. Patient has had an ablation.  Obstetric History: G2P2  Family History:  No family  history on file.  Social History:  Social History   Socioeconomic History  . Marital status: Married    Spouse name: Not on file  . Number of children: Not on file  . Years of education: Not on file  . Highest education level: Not on file  Occupational History  . Not on file  Social Needs  . Financial resource strain: Not on file  . Food insecurity    Worry: Not on file    Inability: Not on file  . Transportation needs    Medical: Not on file    Non-medical: Not on file  Tobacco Use  . Smoking status: Never Smoker  . Smokeless tobacco: Never Used  Substance and Sexual Activity  . Alcohol use: No  . Drug use: Never  . Sexual activity: Yes    Birth control/protection: None  Lifestyle  . Physical activity    Days per week: Not on file    Minutes per session: Not on file  . Stress: Not on file  Relationships  . Social Herbalist on phone: Not on file    Gets together: Not on file    Attends religious service: Not on file    Active member of club or organization: Not on file    Attends meetings of clubs or organizations: Not on file    Relationship status: Not on file  . Intimate partner violence    Fear of current or ex partner: Not on file    Emotionally abused: Not on file  Physically abused: Not on file    Forced sexual activity: Not on file  Other Topics Concern  . Not on file  Social History Narrative  . Not on file    Allergies:  No Known Allergies  Medications: Prior to Admission medications   Medication Sig Start Date End Date Taking? Authorizing Provider  phentermine (ADIPEX-P) 37.5 MG tablet Take 1 tablet (37.5 mg total) by mouth daily before breakfast. 07/09/19  Yes Malachy Mood, MD  traZODone (DESYREL) 50 MG tablet Take 1 tablet (50 mg total) by mouth at bedtime as needed for sleep. Patient not taking: Reported on 08/06/2019 06/11/19   Malachy Mood, MD    Physical Exam Height 5' 10.5" (1.791 m), weight 177 lb (80.3 kg). Wt  Readings from Last 3 Encounters:  08/06/19 177 lb (80.3 kg)  07/09/19 183 lb (83 kg)  06/11/19 189 lb (85.7 kg)  Body mass index is 25.04 kg/m.  No physical exam as this was a remote telephone visit to promote social distancing during the current COVID-19 Pandemic  Assessment: 53 y.o. G2P2 No problem-specific Assessment & Plan notes found for this encounter.   Plan: Problem List Items Addressed This Visit    None    Visit Diagnoses    Overweight (BMI 25.0-29.9)    -  Primary   BMI 25.0-25.9,adult          1) 1500 Calorie ADA Diet  2) Patient education given regarding appropriate lifestyle changes for weight loss including: regular physical activity, healthy coping strategies, caloric restriction and healthy eating patterns.  3) Patient to take medication, with the benefits of appetite suppression and metabolism boost d/w pt, along with the side effects and risk factors of long term use that will be avoided with our use of short bursts of therapy. Rx provided.  - continued good response to phentermine with 12lbs total weight loss - finish out phentermine course, as BMI already down to 25 no need for 4 week follow up   5) Telephone time 3:30min  6)  Return in about 10 months (around 06/05/2020) for annual.    Malachy Mood, MD, [place], [place] 08/06/2019, 4:20 PM

## 2020-01-12 NOTE — Telephone Encounter (Signed)
Patient is schedule 01/20/20 with [facility]

## 2020-01-12 NOTE — Telephone Encounter (Signed)
Needs appointment

## 2020-01-20 ENCOUNTER — Ambulatory Visit: Payer: BC Managed Care – PPO | Admitting: Obstetrics and Gynecology

## 2020-12-08 ENCOUNTER — Other Ambulatory Visit: Payer: Self-pay | Admitting: Family Medicine

## 2020-12-08 DIAGNOSIS — Z1231 Encounter for screening mammogram for malignant neoplasm of breast: Secondary | ICD-10-CM

## 2020-12-16 ENCOUNTER — Other Ambulatory Visit: Payer: Self-pay

## 2020-12-16 ENCOUNTER — Telehealth (INDEPENDENT_AMBULATORY_CARE_PROVIDER_SITE_OTHER): Payer: Self-pay | Admitting: Gastroenterology

## 2020-12-16 DIAGNOSIS — G4700 Insomnia, unspecified: Secondary | ICD-10-CM | POA: Insufficient documentation

## 2020-12-16 DIAGNOSIS — E569 Vitamin deficiency, unspecified: Secondary | ICD-10-CM | POA: Insufficient documentation

## 2020-12-16 DIAGNOSIS — Z86010 Personal history of colonic polyps: Secondary | ICD-10-CM

## 2020-12-16 DIAGNOSIS — M25569 Pain in unspecified knee: Secondary | ICD-10-CM | POA: Insufficient documentation

## 2020-12-16 MED ORDER — NA SULFATE-K SULFATE-MG SULF 17.5-3.13-1.6 GM/177ML PO SOLN
1.0000 | Freq: Once | ORAL | 0 refills | Status: AC
Start: 2020-12-16 — End: 2020-12-16

## 2020-12-16 NOTE — Progress Notes (Signed)
Gastroenterology Pre-Procedure Review  Request Date: Monday 01/16/21 Requesting Physician: Dr. Allen Norris  PATIENT REVIEW QUESTIONS: The patient responded to the following health history questions as indicated:    1. Are you having any GI issues? no 2. Do you have a personal history of Polyps? Yes 03/14/15  3. Do you have a family history of Colon Cancer or Polyps? no 4. Diabetes Mellitus? no 5. Joint replacements in the past 12 months?no 6. Major health problems in the past 3 months?no 7. Any artificial heart valves, MVP, or defibrillator?no    MEDICATIONS & ALLERGIES:    Patient reports the following regarding taking any anticoagulation/antiplatelet therapy:   Plavix, Coumadin, Eliquis, Xarelto, Lovenox, Pradaxa, Brilinta, or Effient? no Aspirin? no  Patient confirms/reports the following medications:  Current Outpatient Medications  Medication Sig Dispense Refill  . montelukast (SINGULAIR) 10 MG tablet Take 10 mg by mouth daily.    Marland Kitchen PARoxetine (PAXIL-CR) 25 MG 24 hr tablet Take 25 mg by mouth daily.    . phentermine (ADIPEX-P) 37.5 MG tablet Take 1 tablet (37.5 mg total) by mouth daily before breakfast. 30 tablet 0   No current facility-administered medications for this visit.    Patient confirms/reports the following allergies:  No Known Allergies  No orders of the defined types were placed in this encounter.   AUTHORIZATION INFORMATION Primary Insurance: 1D#: Group #:  Secondary Insurance: 1D#: Group #:  SCHEDULE INFORMATION: Date: 01/16/21 Time: Location:[facility]

## 2020-12-27 ENCOUNTER — Ambulatory Visit
Admission: RE | Admit: 2020-12-27 | Discharge: 2020-12-27 | Disposition: A | Discharge status: Home / Self Care | Payer: BC Managed Care – PPO | Source: Ambulatory Visit | Attending: Family Medicine | Admitting: Family Medicine

## 2020-12-27 ENCOUNTER — Other Ambulatory Visit: Payer: Self-pay

## 2020-12-27 DIAGNOSIS — Z1231 Encounter for screening mammogram for malignant neoplasm of breast: Secondary | ICD-10-CM | POA: Diagnosis present

## 2021-01-16 ENCOUNTER — Ambulatory Visit: Admit: 2021-01-16 | Payer: BC Managed Care – PPO | Admitting: Gastroenterology

## 2021-01-16 SURGERY — COLONOSCOPY WITH PROPOFOL
Anesthesia: Choice

## 2021-07-19 ENCOUNTER — Encounter: Payer: Self-pay | Admitting: Specialist

## 2022-07-27 IMAGING — MG MM DIGITAL SCREENING BILAT W/ TOMO AND CAD
6 of 10 series · 6 of 30 positions shown · non-contrast
Comparison: Previous exam(s).

CLINICAL DATA: Screening.

EXAM:
DIGITAL SCREENING BILATERAL MAMMOGRAM WITH TOMOSYNTHESIS AND CAD
TECHNIQUE: Bilateral screening digital craniocaudal and mediolateral oblique
mammograms were obtained. Bilateral screening digital breast
tomosynthesis was performed. The images were evaluated with
computer-aided detection.

[R CC synth-2D]
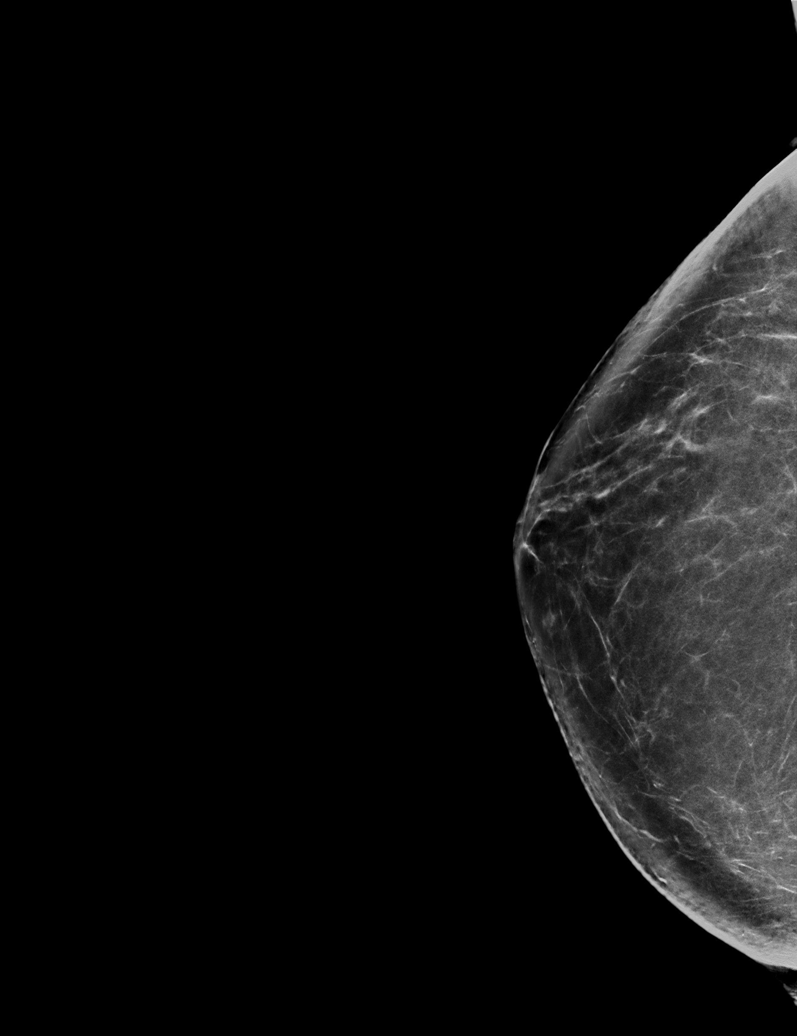

[L MLO synth-2D]
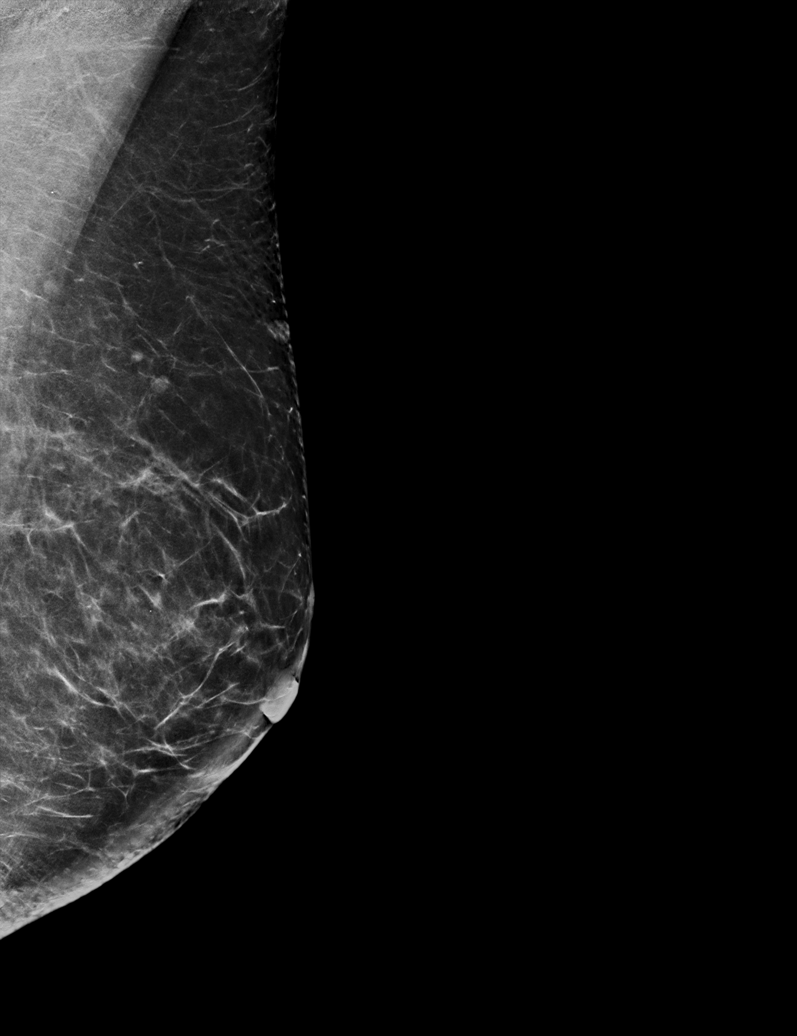

[L XCCL synth-2D]
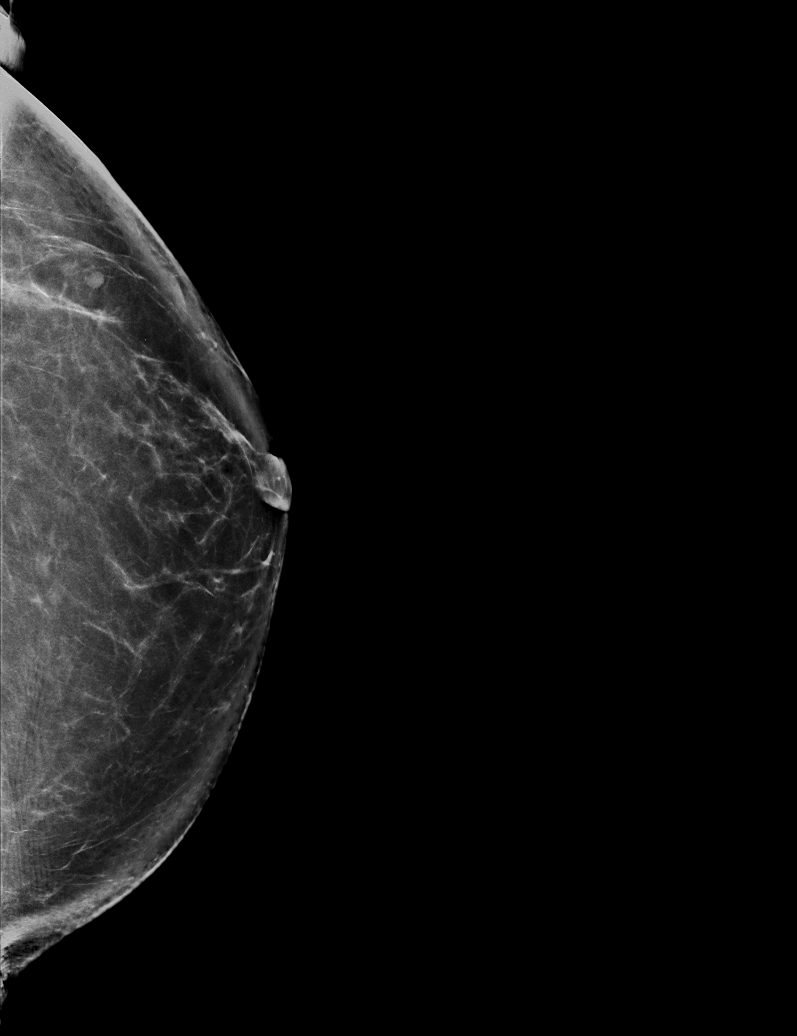

[L CC synth-2D]
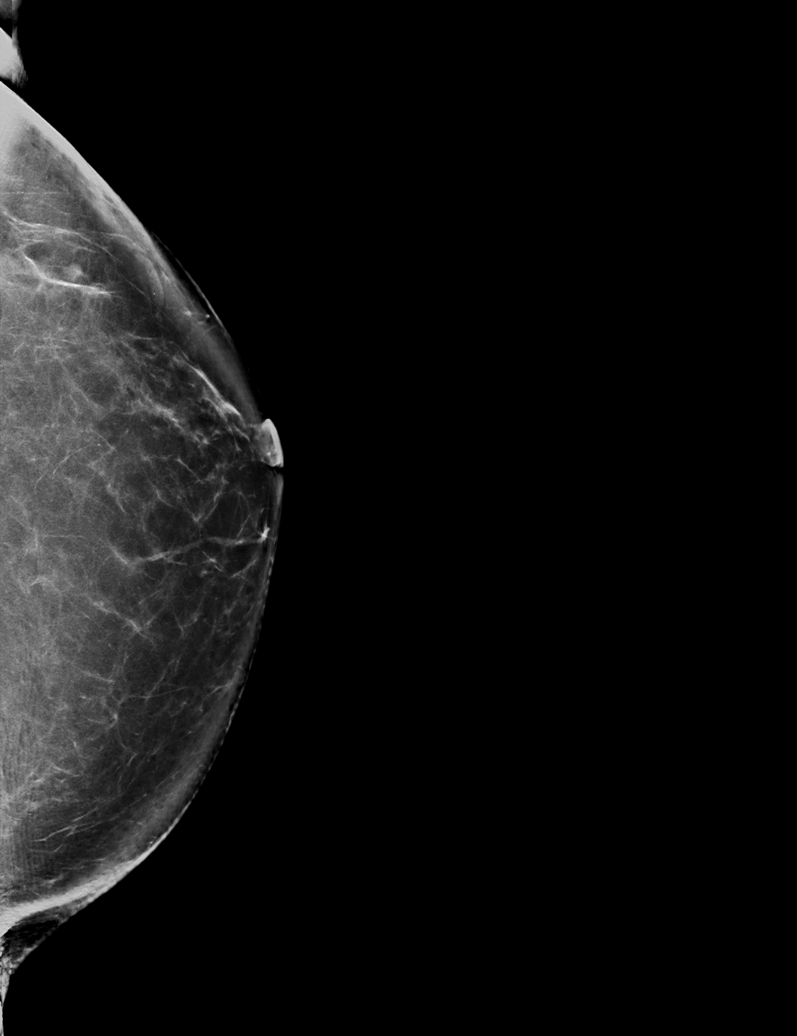

[R MLO synth-2D]
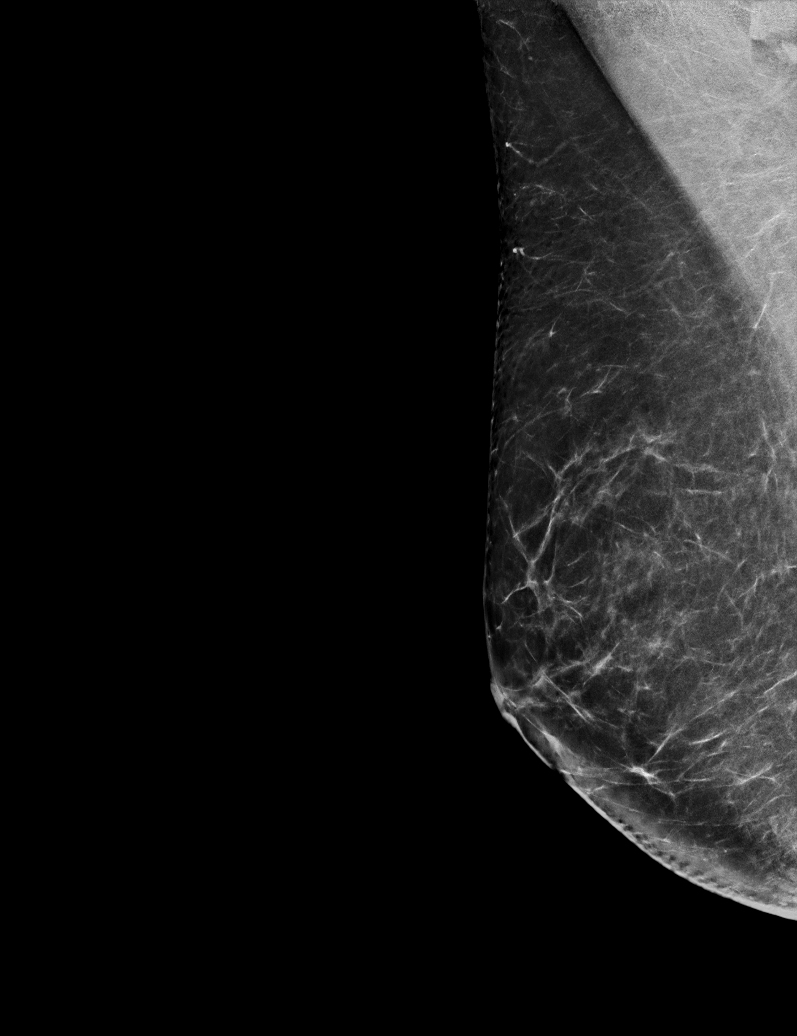

[R CC tomo · tomo slice 41/80.0]
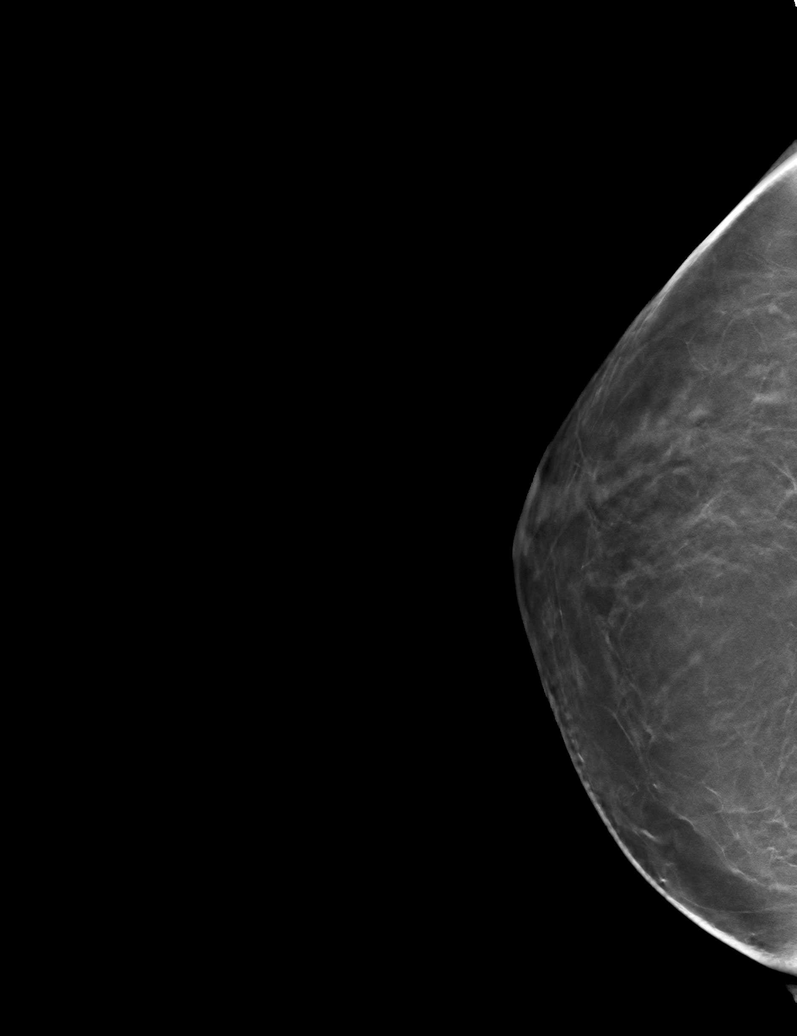

[6 of 30 positions shown; findings below may reference images not displayed]

ACR Breast Density Category b: There are scattered areas of
fibroglandular density.
FINDINGS: There are no findings suspicious for malignancy. The images were
evaluated with computer-aided detection.
IMPRESSION: No mammographic evidence of malignancy. A result letter of this
screening mammogram will be mailed directly to the patient.

RECOMMENDATION:
Screening mammogram in one year. (Code:WJ-I-BG6)

BI-RADS CATEGORY  1: Negative.

## 2024-01-01 ENCOUNTER — Other Ambulatory Visit: Payer: Self-pay | Admitting: Family Medicine

## 2024-01-01 DIAGNOSIS — Z780 Asymptomatic menopausal state: Secondary | ICD-10-CM

## 2024-01-07 ENCOUNTER — Other Ambulatory Visit: Payer: Self-pay | Admitting: Family Medicine

## 2024-01-07 DIAGNOSIS — Z1231 Encounter for screening mammogram for malignant neoplasm of breast: Secondary | ICD-10-CM

## 2024-01-15 ENCOUNTER — Ambulatory Visit
Admission: RE | Admit: 2024-01-15 | Discharge: 2024-01-15 | Disposition: A | Discharge status: Home / Self Care | Payer: Self-pay | Source: Ambulatory Visit | Attending: Family Medicine | Admitting: Family Medicine

## 2024-01-15 DIAGNOSIS — Z780 Asymptomatic menopausal state: Secondary | ICD-10-CM | POA: Insufficient documentation

## 2024-01-15 DIAGNOSIS — Z1231 Encounter for screening mammogram for malignant neoplasm of breast: Secondary | ICD-10-CM | POA: Insufficient documentation
